# Patient Record
Sex: Male | Born: 1960 | Race: White | Hispanic: No | Marital: Married | Smoking: Former smoker
Health system: Southern US, Community
[De-identification: ages and names within clinical notes are randomized; demographics above are authoritative.]

## PROBLEM LIST (undated history)

## (undated) DIAGNOSIS — R350 Frequency of micturition: Secondary | ICD-10-CM

## (undated) DIAGNOSIS — M549 Dorsalgia, unspecified: Secondary | ICD-10-CM

## (undated) DIAGNOSIS — G8929 Other chronic pain: Secondary | ICD-10-CM

## (undated) DIAGNOSIS — R238 Other skin changes: Secondary | ICD-10-CM

## (undated) DIAGNOSIS — M1990 Unspecified osteoarthritis, unspecified site: Secondary | ICD-10-CM

## (undated) DIAGNOSIS — R233 Spontaneous ecchymoses: Secondary | ICD-10-CM

## (undated) DIAGNOSIS — E785 Hyperlipidemia, unspecified: Secondary | ICD-10-CM

## (undated) DIAGNOSIS — J189 Pneumonia, unspecified organism: Secondary | ICD-10-CM

## (undated) DIAGNOSIS — G4730 Sleep apnea, unspecified: Secondary | ICD-10-CM

## (undated) HISTORY — PX: TONSILLECTOMY: SUR1361

---

## 2012-01-23 ENCOUNTER — Emergency Department (HOSPITAL_COMMUNITY)
Admission: EM | Admit: 2012-01-23 | Discharge: 2012-01-23 | Disposition: A | Discharge status: Home / Self Care | Payer: Worker's Compensation | Attending: Emergency Medicine | Admitting: Emergency Medicine

## 2012-01-23 ENCOUNTER — Encounter (HOSPITAL_COMMUNITY): Payer: Self-pay | Admitting: Nurse Practitioner

## 2012-01-23 DIAGNOSIS — M5137 Other intervertebral disc degeneration, lumbosacral region: Secondary | ICD-10-CM | POA: Insufficient documentation

## 2012-01-23 DIAGNOSIS — Z87828 Personal history of other (healed) physical injury and trauma: Secondary | ICD-10-CM | POA: Insufficient documentation

## 2012-01-23 DIAGNOSIS — Q762 Congenital spondylolisthesis: Secondary | ICD-10-CM | POA: Insufficient documentation

## 2012-01-23 DIAGNOSIS — M5416 Radiculopathy, lumbar region: Secondary | ICD-10-CM

## 2012-01-23 DIAGNOSIS — E559 Vitamin D deficiency, unspecified: Secondary | ICD-10-CM | POA: Insufficient documentation

## 2012-01-23 DIAGNOSIS — M51379 Other intervertebral disc degeneration, lumbosacral region without mention of lumbar back pain or lower extremity pain: Secondary | ICD-10-CM | POA: Insufficient documentation

## 2012-01-23 DIAGNOSIS — Z79899 Other long term (current) drug therapy: Secondary | ICD-10-CM | POA: Insufficient documentation

## 2012-01-23 MED ORDER — HYDROCODONE-ACETAMINOPHEN 5-325 MG PO TABS: 2.0000 | ORAL_TABLET | ORAL | Status: AC | PRN

## 2012-01-23 NOTE — Discharge Instructions (Signed)
Lumbosacral Radiculopathy Lumbosacral radiculopathy is a pinched nerve or nerves in the low back (lumbosacral area). When this happens you may have weakness in your legs and may not be able to stand on your toes. You may have pain going down into your legs. There may be difficulties with walking normally. There are many causes of this problem. Sometimes this may happen from an injury, or simply from arthritis or boney problems. It may also be caused by other illnesses such as diabetes. If there is no improvement after treatment, further studies may be done to find the exact cause. DIAGNOSIS  X-rays may be needed if the problems become long standing. Electromyograms may be done. This study is one in which the working of nerves and muscles is studied. HOME CARE INSTRUCTIONS   Applications of ice packs may be helpful. Ice can be used in a plastic bag with a towel around it to prevent frostbite to skin. This may be used every 2 hours for 20 to 30 minutes, or as needed, while awake, or as directed by your caregiver.   Only take over-the-counter or prescription medicines for pain, discomfort, or fever as directed by your caregiver.   If physical therapy was prescribed, follow your caregiver's directions.  SEEK IMMEDIATE MEDICAL CARE IF:   You have pain not controlled with medications.   You seem to be getting worse rather than better.   You develop increasing weakness in your legs.   You develop loss of bowel or bladder control.   You have difficulty with walking or balance, or develop clumsiness in the use of your legs.   You have a fever.  MAKE SURE YOU:   Understand these instructions.   Will watch your condition.   Will get help right away if you are not doing well or get worse.  Document Released: 10/28/2005 Document Revised: 10/17/2011 Document Reviewed: 06/17/2008 [facility] Patient Information 2012 [place], [place].   Dr. Barnett Applebaum office to call you to be seen either tomorrow or  01/28/2012. If you don't hear from his office by noon tomorrow, call to schedule the appointment

## 2012-01-23 NOTE — ED Provider Notes (Signed)
History     CSN: [id number]  Arrival date & time 01/23/12  1132   First MD Initiated Contact with Patient 01/23/12 1232      Chief Complaint  Patient presents with  . 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 Patient sent from his PCP in [place] to here for referral to a spine specialist and worse with movement improved with rest History reviewed. No pertinent past medical history. Past history vitamin D deficiency History reviewed. No pertinent past surgical history.  History reviewed. No pertinent family history.  History  Substance Use Topics  . Smoking status: Never Smoker   . Smokeless tobacco: Not on file  . Alcohol Use: No     social history dips snuff no alcohol no illicit drug use  Review of Systems  Constitutional: Negative.   HENT: Negative.   Respiratory: Negative.   Cardiovascular: Negative.   Gastrointestinal: Negative.   Musculoskeletal: Positive for back pain.  Skin: Negative.   Neurological: Negative.   Hematological: Negative.   Psychiatric/Behavioral: Negative.     Allergies  Review of patient's allergies indicates no known allergies.  Home Medications   Current Outpatient Rx  Name Route Sig Dispense Refill  . ASPIRIN EC 81 MG PO TBEC Oral Take 81 mg by mouth daily.    . CYCLOBENZAPRINE HCL 5 MG PO TABS Oral Take 5 mg by mouth daily as needed. For  muscle spasms    . HYDROXYCHLOROQUINE SULFATE 200 MG PO TABS Oral Take 200 mg by mouth daily.    . IBUPROFEN 800 MG PO TABS Oral Take 800 mg by mouth 3 (three) times daily.    Marland Kitchen PRAVASTATIN SODIUM 40 MG PO TABS Oral Take 40 mg by mouth daily.    . TRAMADOL HCL 50 MG PO TABS Oral Take 50 mg by mouth daily as needed. For pain    . VITAMIN D (ERGOCALCIFEROL) 50000 UNITS PO CAPS Oral Take 50,000 Units by mouth every 7 (seven) days. On sunday      BP 147/92  Pulse 96  Temp(Src) 97.5 F (36.4 C) (Oral)  Resp 20  Ht 6' (1.829 m)  Wt 212 lb (96.163 kg)  BMI 28.75 kg/m2  SpO2 99%  Physical Exam  Constitutional: He appears well-developed and well-nourished.  HENT:  Head: Normocephalic and atraumatic.  Eyes: Conjunctivae are normal. Pupils are equal, round, and reactive to light.  Neck: Neck supple. No tracheal deviation present. No thyromegaly present.  Cardiovascular: Normal rate and regular rhythm.   No murmur heard. Pulmonary/Chest: Effort normal and breath sounds normal.  Abdominal: Soft. Bowel sounds are normal. He exhibits no distension. There is no tenderness.  Musculoskeletal: Normal range of motion. He exhibits no edema and no tenderness.       Entire spine is nontender  Neurological: He is alert. He has normal reflexes. Coordination normal.       No foot drop walks with slight limp favoring left lower extremity all 4 extremities neurovascularly intact  Skin: Skin  is warm and dry. No rash noted.  Psychiatric: He has a normal mood and affect.    ED Course  Procedures (including critical care time)  Labs Reviewed - No data to display No results found.   No diagnosis found. Declined pain medicine presently. Spoke with Dr. Yetta Barre   MDM  Plan prescription Norco Dr. Barnett Applebaum office to call patient for appointment office to be scheduled by [phone] Diagnosis lumbar radiculopathy        Doug Sou, MD 01/23/12 1429

## 2012-01-23 NOTE — ED Notes (Signed)
Pcp sent over for further eval of L leg pain. States he wanted him to come here to see a spinal specialist. Had xrays of back recently that showed "disk problems and pinched nerves."

## 2012-02-06 ENCOUNTER — Other Ambulatory Visit: Payer: Self-pay | Admitting: Neurosurgery

## 2012-02-07 ENCOUNTER — Encounter (HOSPITAL_COMMUNITY): Payer: Self-pay | Admitting: Pharmacy Technician

## 2012-02-12 NOTE — Pre-Procedure Instructions (Signed)
20 SANTO ZAHRADNIK  02/12/2012   Your procedure is scheduled on:  Tues, April 9 @ 0730  Report to [facility] at 0530 AM.  Call this number if you have problems the morning of surgery: [phone]   Remember:   Do not eat food:After Midnight.  May have clear liquids: up to 4 Hours before arrival.(until 1:30 am)  Clear liquids include soda, tea, black coffee, apple or grape juice, broth,water  Take these medicines the morning of surgery with A SIP OF WATER:    Do not wear jewelry  Do not wear lotions, powders, or perfumes.   Do not bring valuables to the hospital.  Contacts, dentures or bridgework may not be worn into surgery.  Leave suitcase in the car. After surgery it may be brought to your room.  For patients admitted to the hospital, checkout time is 11:00 AM the day of discharge.   Patients discharged the day of surgery will not be allowed to drive home.     Please read over the following fact sheets that you were given:

## 2012-02-13 ENCOUNTER — Other Ambulatory Visit: Payer: Self-pay

## 2012-02-13 ENCOUNTER — Encounter (HOSPITAL_COMMUNITY)
Admission: RE | Admit: 2012-02-13 | Discharge: 2012-02-13 | Disposition: A | Discharge status: Home / Self Care | Payer: Worker's Compensation | Source: Ambulatory Visit | Attending: Anesthesiology | Admitting: Anesthesiology

## 2012-02-13 ENCOUNTER — Encounter (HOSPITAL_COMMUNITY): Payer: Self-pay

## 2012-02-13 ENCOUNTER — Encounter (HOSPITAL_COMMUNITY)
Admission: RE | Admit: 2012-02-13 | Discharge: 2012-02-13 | Disposition: A | Discharge status: Home / Self Care | Payer: Worker's Compensation | Source: Ambulatory Visit | Attending: Neurosurgery | Admitting: Neurosurgery

## 2012-02-13 HISTORY — DX: Other skin changes: R23.8

## 2012-02-13 HISTORY — DX: Pneumonia, unspecified organism: J18.9

## 2012-02-13 HISTORY — DX: Unspecified osteoarthritis, unspecified site: M19.90

## 2012-02-13 HISTORY — DX: Hyperlipidemia, unspecified: E78.5

## 2012-02-13 HISTORY — DX: Other chronic pain: G89.29

## 2012-02-13 HISTORY — DX: Sleep apnea, unspecified: G47.30

## 2012-02-13 HISTORY — DX: Frequency of micturition: R35.0

## 2012-02-13 HISTORY — DX: Spontaneous ecchymoses: R23.3

## 2012-02-13 HISTORY — DX: Dorsalgia, unspecified: M54.9

## 2012-02-13 LAB — URINALYSIS, ROUTINE W REFLEX MICROSCOPIC
Glucose, UA: NEGATIVE mg/dL
Hgb urine dipstick: NEGATIVE
Leukocytes, UA: NEGATIVE
Protein, ur: NEGATIVE mg/dL
Specific Gravity, Urine: 1.012 (ref 1.005–1.030)
Urobilinogen, UA: 0.2 mg/dL (ref 0.0–1.0)

## 2012-02-13 LAB — DIFFERENTIAL
Eosinophils Absolute: 0.2 10*3/uL (ref 0.0–0.7)
Lymphs Abs: 2.6 10*3/uL (ref 0.7–4.0)
Monocytes Relative: 10 % (ref 3–12)
Neutro Abs: 4.7 10*3/uL (ref 1.7–7.7)
Neutrophils Relative %: 56 % (ref 43–77)

## 2012-02-13 LAB — SURGICAL PCR SCREEN
MRSA, PCR: NEGATIVE
Staphylococcus aureus: POSITIVE — AB

## 2012-02-13 LAB — BASIC METABOLIC PANEL
Calcium: 10.4 mg/dL (ref 8.4–10.5)
GFR calc Af Amer: >90 mL/min (ref >90)
GFR calc non Af Amer: >90 mL/min (ref >90)
Potassium: 4.9 mEq/L (ref 3.5–5.1)
Sodium: 140 mEq/L (ref 135–145)

## 2012-02-13 LAB — CBC
Hemoglobin: 14.8 g/dL (ref 13.0–17.0)
MCH: 31.2 pg (ref 26.0–34.0)
Platelets: 265 10*3/uL (ref 150–400)
RBC: 4.74 MIL/uL (ref 4.22–5.81)
WBC: 8.3 10*3/uL (ref 4.0–10.5)

## 2012-02-13 NOTE — Progress Notes (Signed)
pts mom and uncle died of Lougar[place] disease-wife requested this info to be placed into computer

## 2012-02-13 NOTE — Progress Notes (Signed)
Verified with Shanda Bumps at Dr.Hirsch's office that thigh high ted hose to be ordered

## 2012-02-13 NOTE — Progress Notes (Signed)
Pt doesn't have a cardiologist  Medical MD is Dr.Fuentes at [facility] in [place]  Denies ever having an echo/stress test/heart cath

## 2012-02-13 NOTE — Progress Notes (Signed)
Sleep study to be requested from Dr.Fuentes

## 2012-02-14 NOTE — Progress Notes (Signed)
Call to Dr. Iris Pert office, [facility], MD must sign all records being released to other offices.  Therefore when he returns to the office on 02/17/2012, he will sign & then record can be faxed to [facility]

## 2012-02-17 MED ORDER — CEFAZOLIN SODIUM-DEXTROSE 2-3 GM-% IV SOLR
2.0000 g | INTRAVENOUS | Status: AC
  Administered 2012-02-18: 2 g via INTRAVENOUS
  Filled 2012-02-17: qty 50

## 2012-02-18 ENCOUNTER — Encounter (HOSPITAL_COMMUNITY)
Admission: RE | Disposition: A | Discharge status: Home / Self Care | Payer: Self-pay | Source: Ambulatory Visit | Attending: Neurosurgery

## 2012-02-18 ENCOUNTER — Ambulatory Visit (HOSPITAL_COMMUNITY): Payer: Worker's Compensation

## 2012-02-18 ENCOUNTER — Ambulatory Visit (HOSPITAL_COMMUNITY)
Admission: RE | Admit: 2012-02-18 | Discharge: 2012-02-18 | Disposition: A | Discharge status: Home / Self Care | Payer: Worker's Compensation | Source: Ambulatory Visit | Attending: Neurosurgery | Admitting: Neurosurgery

## 2012-02-18 ENCOUNTER — Encounter (HOSPITAL_COMMUNITY): Payer: Self-pay

## 2012-02-18 ENCOUNTER — Encounter (HOSPITAL_COMMUNITY): Payer: Self-pay | Admitting: Certified Registered Nurse Anesthetist

## 2012-02-18 ENCOUNTER — Ambulatory Visit (HOSPITAL_COMMUNITY): Payer: Worker's Compensation | Admitting: Certified Registered Nurse Anesthetist

## 2012-02-18 DIAGNOSIS — Z79899 Other long term (current) drug therapy: Secondary | ICD-10-CM | POA: Insufficient documentation

## 2012-02-18 DIAGNOSIS — Z01812 Encounter for preprocedural laboratory examination: Secondary | ICD-10-CM | POA: Insufficient documentation

## 2012-02-18 DIAGNOSIS — Z01810 Encounter for preprocedural cardiovascular examination: Secondary | ICD-10-CM | POA: Insufficient documentation

## 2012-02-18 DIAGNOSIS — M4716 Other spondylosis with myelopathy, lumbar region: Secondary | ICD-10-CM | POA: Insufficient documentation

## 2012-02-18 DIAGNOSIS — M5106 Intervertebral disc disorders with myelopathy, lumbar region: Secondary | ICD-10-CM | POA: Insufficient documentation

## 2012-02-18 DIAGNOSIS — G4730 Sleep apnea, unspecified: Secondary | ICD-10-CM | POA: Insufficient documentation

## 2012-02-18 DIAGNOSIS — M48061 Spinal stenosis, lumbar region without neurogenic claudication: Secondary | ICD-10-CM | POA: Insufficient documentation

## 2012-02-18 HISTORY — PX: LUMBAR LAMINECTOMY/DECOMPRESSION MICRODISCECTOMY: SHX5026

## 2012-02-18 LAB — PROTIME-INR: INR: 0.93 (ref 0.00–1.49)

## 2012-02-18 SURGERY — LUMBAR LAMINECTOMY/DECOMPRESSION MICRODISCECTOMY 2 LEVELS
Anesthesia: General | Laterality: Left | Wound class: Clean

## 2012-02-18 MED ORDER — THROMBIN 5000 UNITS EX SOLR
CUTANEOUS | Status: DC | PRN
  Administered 2012-02-18 (×2): 5000 [IU] via TOPICAL

## 2012-02-18 MED ORDER — MORPHINE SULFATE 2 MG/ML IJ SOLN: 0.0500 mg/kg | INTRAMUSCULAR | Status: DC | PRN

## 2012-02-18 MED ORDER — SODIUM CHLORIDE 0.9 % IV SOLN
INTRAVENOUS | Status: AC
  Filled 2012-02-18: qty 500

## 2012-02-18 MED ORDER — CEFAZOLIN SODIUM 1-5 GM-% IV SOLN
1.0000 g | Freq: Three times a day (TID) | INTRAVENOUS | Status: DC
  Administered 2012-02-18: 1 g via INTRAVENOUS
  Filled 2012-02-18 (×2): qty 50

## 2012-02-18 MED ORDER — ONDANSETRON HCL 4 MG/2ML IJ SOLN: 4.0000 mg | INTRAMUSCULAR | Status: DC | PRN

## 2012-02-18 MED ORDER — BISACODYL 10 MG RE SUPP: 10.0000 mg | Freq: Every day | RECTAL | Status: DC | PRN

## 2012-02-18 MED ORDER — ACETAMINOPHEN 650 MG RE SUPP: 650.0000 mg | RECTAL | Status: DC | PRN

## 2012-02-18 MED ORDER — 0.9 % SODIUM CHLORIDE (POUR BTL) OPTIME
TOPICAL | Status: DC | PRN
  Administered 2012-02-18: 1000 mL

## 2012-02-18 MED ORDER — DOCUSATE SODIUM 100 MG PO CAPS: 100.0000 mg | ORAL_CAPSULE | Freq: Two times a day (BID) | ORAL | Status: DC

## 2012-02-18 MED ORDER — MAGNESIUM HYDROXIDE 400 MG/5ML PO SUSP: 30.0000 mL | Freq: Every day | ORAL | Status: DC | PRN

## 2012-02-18 MED ORDER — METHOCARBAMOL 100 MG/ML IJ SOLN
500.0000 mg | Freq: Four times a day (QID) | INTRAVENOUS | Status: DC | PRN
  Filled 2012-02-18: qty 5

## 2012-02-18 MED ORDER — ROCURONIUM BROMIDE 100 MG/10ML IV SOLN
INTRAVENOUS | Status: DC | PRN
  Administered 2012-02-18: 50 mg via INTRAVENOUS

## 2012-02-18 MED ORDER — BACITRACIN 50000 UNITS IM SOLR
INTRAMUSCULAR | Status: AC
  Filled 2012-02-18: qty 1

## 2012-02-18 MED ORDER — KETOROLAC TROMETHAMINE 30 MG/ML IJ SOLN
30.0000 mg | Freq: Four times a day (QID) | INTRAMUSCULAR | Status: DC
  Administered 2012-02-18 (×2): 30 mg via INTRAVENOUS
  Filled 2012-02-18: qty 1

## 2012-02-18 MED ORDER — CYCLOBENZAPRINE HCL 10 MG PO TABS: 10.0000 mg | ORAL_TABLET | Freq: Three times a day (TID) | ORAL | Status: AC | PRN

## 2012-02-18 MED ORDER — FENTANYL CITRATE 0.05 MG/ML IJ SOLN
INTRAMUSCULAR | Status: DC | PRN
  Administered 2012-02-18: 50 ug via INTRAVENOUS
  Administered 2012-02-18: 150 ug via INTRAVENOUS
  Administered 2012-02-18: 50 ug via INTRAVENOUS

## 2012-02-18 MED ORDER — OXYCODONE-ACETAMINOPHEN 5-325 MG PO TABS
1.0000 | ORAL_TABLET | ORAL | Status: AC | PRN
Start: 2012-02-18 — End: 2012-02-28

## 2012-02-18 MED ORDER — SODIUM CHLORIDE 0.9 % IJ SOLN: 3.0000 mL | INTRAMUSCULAR | Status: DC | PRN

## 2012-02-18 MED ORDER — PROPOFOL 10 MG/ML IV BOLUS
INTRAVENOUS | Status: DC | PRN
  Administered 2012-02-18: 150 mg via INTRAVENOUS

## 2012-02-18 MED ORDER — PROMETHAZINE HCL 12.5 MG PO TABS
12.5000 mg | ORAL_TABLET | ORAL | Status: DC | PRN
  Filled 2012-02-18: qty 2

## 2012-02-18 MED ORDER — PHENOL 1.4 % MT LIQD: 1.0000 | OROMUCOSAL | Status: DC | PRN

## 2012-02-18 MED ORDER — OXYCODONE-ACETAMINOPHEN 5-325 MG PO TABS
1.0000 | ORAL_TABLET | ORAL | Status: DC | PRN
  Administered 2012-02-18: 2 via ORAL
  Filled 2012-02-18: qty 2

## 2012-02-18 MED ORDER — ONDANSETRON HCL 4 MG/2ML IJ SOLN: 4.0000 mg | Freq: Once | INTRAMUSCULAR | Status: DC | PRN

## 2012-02-18 MED ORDER — GLYCOPYRROLATE 0.2 MG/ML IJ SOLN
INTRAMUSCULAR | Status: DC | PRN
  Administered 2012-02-18: 0.4 mg via INTRAVENOUS

## 2012-02-18 MED ORDER — MORPHINE SULFATE 4 MG/ML IJ SOLN: 1.0000 mg | INTRAMUSCULAR | Status: DC | PRN

## 2012-02-18 MED ORDER — LIDOCAINE-EPINEPHRINE 1 %-1:100000 IJ SOLN
INTRAMUSCULAR | Status: DC | PRN
  Administered 2012-02-18: 30 mL

## 2012-02-18 MED ORDER — HYDROMORPHONE HCL PF 1 MG/ML IJ SOLN: 0.2500 mg | INTRAMUSCULAR | Status: DC | PRN

## 2012-02-18 MED ORDER — SODIUM CHLORIDE 0.9 % IR SOLN
Status: DC | PRN
  Administered 2012-02-18: 07:00:00

## 2012-02-18 MED ORDER — LACTATED RINGERS IV SOLN
INTRAVENOUS | Status: DC | PRN
  Administered 2012-02-18 (×2): via INTRAVENOUS

## 2012-02-18 MED ORDER — KETOROLAC TROMETHAMINE 30 MG/ML IJ SOLN
INTRAMUSCULAR | Status: AC
  Filled 2012-02-18: qty 1

## 2012-02-18 MED ORDER — NEOSTIGMINE METHYLSULFATE 1 MG/ML IJ SOLN
INTRAMUSCULAR | Status: DC | PRN
  Administered 2012-02-18: 3 mg via INTRAVENOUS

## 2012-02-18 MED ORDER — MIDAZOLAM HCL 5 MG/5ML IJ SOLN
INTRAMUSCULAR | Status: DC | PRN
  Administered 2012-02-18: 2 mg via INTRAVENOUS

## 2012-02-18 MED ORDER — SODIUM CHLORIDE 0.9 % IJ SOLN
3.0000 mL | Freq: Two times a day (BID) | INTRAMUSCULAR | Status: DC
  Administered 2012-02-18: 3 mL via INTRAVENOUS

## 2012-02-18 MED ORDER — PROMETHAZINE HCL 25 MG/ML IJ SOLN: 12.5000 mg | INTRAMUSCULAR | Status: DC | PRN

## 2012-02-18 MED ORDER — KCL IN DEXTROSE-NACL 20-5-0.45 MEQ/L-%-% IV SOLN
INTRAVENOUS | Status: DC
  Filled 2012-02-18 (×2): qty 1000

## 2012-02-18 MED ORDER — METHOCARBAMOL 500 MG PO TABS: 500.0000 mg | ORAL_TABLET | Freq: Four times a day (QID) | ORAL | Status: DC | PRN

## 2012-02-18 MED ORDER — ZOLPIDEM TARTRATE 10 MG PO TABS: 10.0000 mg | ORAL_TABLET | Freq: Every evening | ORAL | Status: DC | PRN

## 2012-02-18 MED ORDER — ACETAMINOPHEN 325 MG PO TABS: 650.0000 mg | ORAL_TABLET | ORAL | Status: DC | PRN

## 2012-02-18 MED ORDER — MENTHOL 3 MG MT LOZG: 1.0000 | LOZENGE | OROMUCOSAL | Status: DC | PRN

## 2012-02-18 MED ORDER — HEMOSTATIC AGENTS (NO CHARGE) OPTIME
TOPICAL | Status: DC | PRN
  Administered 2012-02-18: 1 via TOPICAL

## 2012-02-18 MED ORDER — CYCLOBENZAPRINE HCL 10 MG PO TABS: 10.0000 mg | ORAL_TABLET | Freq: Three times a day (TID) | ORAL | Status: DC | PRN

## 2012-02-18 MED ORDER — HYDROCODONE-ACETAMINOPHEN 5-325 MG PO TABS: 1.0000 | ORAL_TABLET | ORAL | Status: DC | PRN

## 2012-02-18 MED ORDER — ONDANSETRON HCL 4 MG/2ML IJ SOLN
INTRAMUSCULAR | Status: DC | PRN
  Administered 2012-02-18: 4 mg via INTRAVENOUS

## 2012-02-18 SURGICAL SUPPLY — 54 items
BAG DECANTER FOR FLEXI CONT (MISCELLANEOUS) ×2 IMPLANT
BENZOIN TINCTURE PRP APPL 2/3 (GAUZE/BANDAGES/DRESSINGS) ×2 IMPLANT
BLADE SURG ROTATE 9660 (MISCELLANEOUS) IMPLANT
BUR ACORN 6.0 ACORN (BURR) ×2 IMPLANT
BUR ACRON 5.0MM COATED (BURR) ×2 IMPLANT
BUR ROUND FLUTED 5 RND (BURR) ×2 IMPLANT
CANISTER SUCTION 2500CC (MISCELLANEOUS) ×2 IMPLANT
CLOTH BEACON ORANGE TIMEOUT ST (SAFETY) ×2 IMPLANT
CONT SPEC 4OZ CLIKSEAL STRL BL (MISCELLANEOUS) IMPLANT
DRAPE LAPAROTOMY 100X72X124 (DRAPES) ×2 IMPLANT
DRAPE MICROSCOPE LEICA (MISCELLANEOUS) ×2 IMPLANT
DRAPE POUCH INSTRU U-SHP 10X18 (DRAPES) ×2 IMPLANT
DRAPE SURG 17X23 STRL (DRAPES) ×2 IMPLANT
DRESSING TELFA 8X3 (GAUZE/BANDAGES/DRESSINGS) ×2 IMPLANT
DURAPREP 26ML APPLICATOR (WOUND CARE) ×2 IMPLANT
ELECT REM PT RETURN 9FT ADLT (ELECTROSURGICAL) ×2
ELECTRODE REM PT RTRN 9FT ADLT (ELECTROSURGICAL) ×1 IMPLANT
GAUZE SPONGE 4X4 16PLY XRAY LF (GAUZE/BANDAGES/DRESSINGS) IMPLANT
GLOVE BIOGEL M 8.0 STRL (GLOVE) ×2 IMPLANT
GLOVE BIOGEL PI IND STRL 6.5 (GLOVE) ×1 IMPLANT
GLOVE BIOGEL PI INDICATOR 6.5 (GLOVE) ×1
GLOVE ECLIPSE 7.0 STRL STRAW (GLOVE) ×2 IMPLANT
GLOVE ECLIPSE 7.5 STRL STRAW (GLOVE) ×2 IMPLANT
GLOVE EXAM NITRILE LRG STRL (GLOVE) IMPLANT
GLOVE EXAM NITRILE MD LF STRL (GLOVE) IMPLANT
GLOVE EXAM NITRILE XL STR (GLOVE) IMPLANT
GLOVE EXAM NITRILE XS STR PU (GLOVE) IMPLANT
GLOVE INDICATOR 7.0 STRL GRN (GLOVE) ×4 IMPLANT
GOWN BRE IMP SLV AUR LG STRL (GOWN DISPOSABLE) ×4 IMPLANT
GOWN BRE IMP SLV AUR XL STRL (GOWN DISPOSABLE) ×2 IMPLANT
GOWN STRL REIN 2XL LVL4 (GOWN DISPOSABLE) IMPLANT
KIT BASIN OR (CUSTOM PROCEDURE TRAY) ×2 IMPLANT
KIT ROOM TURNOVER OR (KITS) ×2 IMPLANT
NEEDLE HYPO 18GX1.5 BLUNT FILL (NEEDLE) IMPLANT
NEEDLE HYPO 22GX1.5 SAFETY (NEEDLE) ×4 IMPLANT
NS IRRIG 1000ML POUR BTL (IV SOLUTION) ×2 IMPLANT
PACK LAMINECTOMY NEURO (CUSTOM PROCEDURE TRAY) ×2 IMPLANT
PAD ARMBOARD 7.5X6 YLW CONV (MISCELLANEOUS) ×6 IMPLANT
PATTIES SURGICAL .75X.75 (GAUZE/BANDAGES/DRESSINGS) ×2 IMPLANT
RUBBERBAND STERILE (MISCELLANEOUS) ×4 IMPLANT
SPONGE GAUZE 4X4 12PLY (GAUZE/BANDAGES/DRESSINGS) ×2 IMPLANT
SPONGE LAP 4X18 X RAY DECT (DISPOSABLE) IMPLANT
SPONGE SURGIFOAM ABS GEL SZ50 (HEMOSTASIS) ×2 IMPLANT
STRIP CLOSURE SKIN 1/2X4 (GAUZE/BANDAGES/DRESSINGS) ×2 IMPLANT
SUT PROLENE 6 0 BV (SUTURE) IMPLANT
SUT VIC AB 0 CT1 18XCR BRD8 (SUTURE) ×1 IMPLANT
SUT VIC AB 0 CT1 8-18 (SUTURE) ×1
SUT VIC AB 2-0 CP2 18 (SUTURE) ×2 IMPLANT
SUT VIC AB 3-0 SH 8-18 (SUTURE) ×2 IMPLANT
SYR 20CC LL (SYRINGE) ×2 IMPLANT
SYR 5ML LL (SYRINGE) IMPLANT
TOWEL OR 17X24 6PK STRL BLUE (TOWEL DISPOSABLE) ×2 IMPLANT
TOWEL OR 17X26 10 PK STRL BLUE (TOWEL DISPOSABLE) ×2 IMPLANT
WATER STERILE IRR 1000ML POUR (IV SOLUTION) ×2 IMPLANT

## 2012-02-18 NOTE — Anesthesia Postprocedure Evaluation (Signed)
Anesthesia Post Note  Patient: Andrew Pugh  Procedure(s) Performed: Procedure(s) (LRB): LUMBAR LAMINECTOMY/DECOMPRESSION MICRODISCECTOMY 2 LEVELS (Left)  Anesthesia type: general  Patient location: PACU  Post pain: Pain level controlled  Post assessment: Patient's Cardiovascular Status Stable  Last Vitals:  Filed Vitals:   02/18/12 1200  BP: 118/83  Pulse: 79  Temp: 36.4 C  Resp: 16    Post vital signs: Reviewed and stable  Level of consciousness: sedated  Complications: No apparent anesthesia complications

## 2012-02-18 NOTE — H&P (Signed)
See H& P.

## 2012-02-18 NOTE — Interval H&P Note (Signed)
History and Physical Interval Note:  02/18/2012 7:28 AM  Andrew Pugh  has presented today for surgery, with the diagnosis of Lumbar hnp with myelopathy, Lumbar radiculopathy, Lumbar spondylosis with myelopathy  The various methods of treatment have been discussed with the patient and family. After consideration of risks, benefits and other options for treatment, the patient has consented to  Procedure(s) (LRB): LUMBAR LAMINECTOMY/DECOMPRESSION MICRODISCECTOMY 2 LEVELS (Left) as a surgical intervention .  The patients' history has been reviewed, patient examined, no change in status, stable for surgery.  I have reviewed the patients' chart and labs.  Questions were answered to the patient's satisfaction.     [name]

## 2012-02-18 NOTE — Op Note (Signed)
02/18/2012  9:48 AM  PATIENT:  Andrew Pugh  51 y.o. male  PRE-OPERATIVE DIAGNOSIS:lumbar stenosis,   Lumbar hnp with myelopathy, Lumbar radiculopathy, Lumbar spondylosis with myelopathy,   POST-OPERATIVE DIAGNOSIS:, lumbar stenosis,   Lumbar hnp with myelopathy, Lumbar radiculopathy, Lumbar spondylosis with myelopathy  PROCEDURE:  Procedure(s): Decompressive laminectomy decompressing left  L3, L4, L5 roots  (3L) ,  Discectomy Left L3-4 , microdisection  SURGEON:  Surgeon(s): Clydene Fake, MD Karn Cassis, MD-ASSISTANT    ANESTHESIA:   general  EBL:  Total I/O In: 1000 [I.V.:1000] Out: 100 [Blood:100]  BLOOD ADMINISTERED:none  DRAINS: none   SPECIMEN:  No Specimen  DICTATION: Patient with back left leg pain numbness weakness MRI done showing severe spinal change multiple holes with stenosis and on top of the left sides of the L3-4 large extruded fragment going heartily significant facet hypertrophy at 34 and 4: Stenosis at both levels lateral recess stenosis 3-4 portal and the left patient brought in for decompression laminectomy left side from L3-5 with discectomy 34.  Patient brought in the operating room general anesthesia induced patient placed in prone position Wilson frame all pressure points padded. Patient under sterile fashion segments inject with 20 cc 1% lidocaine with epinephrine. Needle was placed in interspace x-rays obtained anesthesia then he was putting at the L3 spinous process) C3-4 disc space incision was then made starting just slightly over the needle was at going heartily incision taken the fascia hemostasis obtained with Bovie position fascia was incised and subperiosteal dissection done over the L3-4 Vitoss process lamina to the facets obtaining retractor placed markers were placed and the 34 and 45 interspace and x-ray was obtained confirming or positioning. Microscope microscope was brought in for microdissection at this point. High-speed drill due  to started decompressive hemilaminectomy removing the inferior part of the L3 lamina all the L4-L5 lamina and medial facetectomies were also done facets were large and pinching into the canal these were disimpacted was removed decompressing the central canal. We made sure the L3-L4 and L5 nerve roots were well decompressed and the foramen well. And explored the disc space 0.5 (disposed of the disc herniations and we did not to the disc space there. We explored this patient 87 again is allergic progress at this point and inferiorly a large free fragment of the disc herniated out and this was removed with hooks and pituitary rongeurs. We were finished we did decompression the thecal sac and nerve root out the L3-L4 L5 on the left side. We did not enter the disc space and 3 for his removing the herniated fragment. Hemostasis with Gelfoam and thrombin this was irrigated and ear about solution which had very good hemostasis retractors removed fascia closed with 0 Vicryl interrupted sutures excess tissue closed with 020 3-0 Vicryl interrupted sutures skin closed benzoin Steri-Strips dressing was placed patient was awoken from anesthesia and transferred to recovery room  PLAN OF CARE: Admit for overnight observation  PATIENT DISPOSITION:  PACU - hemodynamically stable.

## 2012-02-18 NOTE — Anesthesia Preprocedure Evaluation (Addendum)
Anesthesia Evaluation  Patient identified by MRN, date of birth, ID band Patient awake    Reviewed: Allergy & Precautions, H&P , NPO status , Patient's Chart, lab work & pertinent test results  Airway Mallampati: II TM Distance: >3 FB Neck ROM: Full    Dental  (+) Teeth Intact and Dental Advisory Given   Pulmonary sleep apnea ,    Pulmonary exam normal       Cardiovascular negative cardio ROS      Neuro/Psych Left leg weakness and pain  negative psych ROS   GI/Hepatic negative GI ROS, Neg liver ROS,   Endo/Other  negative endocrine ROS  Renal/GU negative Renal ROS     Musculoskeletal  (+) Arthritis -, Osteoarthritis,    Abdominal Normal abdominal exam  (+)   Peds  Hematology negative hematology ROS (+)   Anesthesia Other Findings   Reproductive/Obstetrics                         Anesthesia Physical Anesthesia Plan  ASA: III  Anesthesia Plan: General   Post-op Pain Management:    Induction: Intravenous  Airway Management Planned: Oral ETT  Additional Equipment:   Intra-op Plan:   Post-operative Plan: Extubation in OR  Informed Consent: I have reviewed the patients History and Physical, chart, labs and discussed the procedure including the risks, benefits and alternatives for the proposed anesthesia with the patient or authorized representative who has indicated his/her understanding and acceptance.   Dental advisory given  Plan Discussed with: Surgeon and CRNA  Anesthesia Plan Comments:       Anesthesia Quick Evaluation

## 2012-02-18 NOTE — Transfer of Care (Signed)
Immediate Anesthesia Transfer of Care Note  Patient: Andrew Pugh  Procedure(s) Performed: Procedure(s) (LRB): LUMBAR LAMINECTOMY/DECOMPRESSION MICRODISCECTOMY 2 LEVELS (Left)  Patient Location: PACU  Anesthesia Type: General  Level of Consciousness: awake, alert  and oriented  Airway & Oxygen Therapy: Patient Spontanous Breathing and Patient connected to face mask  Post-op Assessment: Report given to PACU RN, Post -op Vital signs reviewed and stable and Patient moving all extremities X 4  Post vital signs: Reviewed and stable  Complications: No apparent anesthesia complications

## 2012-02-18 NOTE — Preoperative (Signed)
Beta Blockers   Reason not to administer Beta Blockers:Not Applicable 

## 2012-02-18 NOTE — Anesthesia Procedure Notes (Signed)
Procedure Name: Intubation Date/Time: 02/18/2012 7:47 AM Performed by: Delbert Harness Pre-anesthesia Checklist: Patient identified, Emergency Drugs available, Suction available and Patient being monitored Patient Re-evaluated:Patient Re-evaluated prior to inductionOxygen Delivery Method: Circle system utilized Preoxygenation: Pre-oxygenation with 100% oxygen Intubation Type: IV induction Ventilation: Oral airway inserted - appropriate to patient size and Mask ventilation without difficulty Laryngoscope Size: Mac and 3 Grade View: Grade II Tube type: Oral Tube size: 7.5 mm Number of attempts: 1 Airway Equipment and Method: Stylet and Bite block Placement Confirmation: breath sounds checked- equal and bilateral,  positive ETCO2 and CO2 detector Secured at: 22 cm Tube secured with: Tape Dental Injury: Teeth and Oropharynx as per pre-operative assessment

## 2012-02-18 NOTE — Discharge Summary (Signed)
Physician Discharge Summary  Patient ID: TEION BALLIN MRN: [id number] DOB/AGE: [birth year] 51 y.o.  Admit date: 02/18/2012 Discharge date: 02/18/2012  Admission Diagnoses:lumbar stenosis, Lumbar hnp with myelopathy, Lumbar radiculopathy, Lumbar spondylosis with myelopathy,    Discharge Diagnoses: lumbar stenosis, Lumbar hnp with myelopathy, Lumbar radiculopathy, Lumbar spondylosis with myelopathy,   Active Problems:  * No active hospital problems. *    Discharged Condition: good  Hospital Course: pt admitted day of surgery, underwent procedure below - pt doing well, eating, ambulating, less leg pain -  Consults: None  Significant Diagnostic Studies: none  Treatments: surgery: Decompressive laminectomy decompressing left L3, L4, L5 roots (3L) , Discectomy Left L3-4 , microdisection   Discharge Exam: Blood pressure 118/83, pulse 79, temperature 97.5 F (36.4 C), temperature source Oral, resp. rate 16, height 6' (1.829 m), weight 92.987 kg (205 lb), SpO2 96.00%. Wound:c/d/i  Disposition: home   Medication List  As of 02/18/2012  2:37 PM   TAKE these medications         cyclobenzaprine 10 MG tablet   Commonly known as: FLEXERIL   Take 1 tablet (10 mg total) by mouth 3 (three) times daily as needed for muscle spasms.      diclofenac 75 MG EC tablet   Commonly known as: VOLTAREN   Take 75 mg by mouth 2 (two) times daily.      oxyCODONE-acetaminophen 5-325 MG per tablet   Commonly known as: PERCOCET   Take 1-2 tablets by mouth every 4 (four) hours as needed for pain.             Signed: Clydene Fake, MD 02/18/2012, 2:37 PM

## 2012-02-18 NOTE — Discharge Instructions (Signed)
Wound Care Keep incision covered and dry for 5 days.  If you shower prior to then, cover incision with plastic wrap.  You may remove outer bandage after 5 days and shower.  Do not put any creams, lotions, or ointments on incision. Leave steri-strips on.  They will fall off by themselves. Activity Walk each and every day, increasing distance each day. No lifting greater than 5 lbs.  Avoid bending, arching, or twisting. No driving for 2 weeks; may ride as a passenger locally. If provided with back brace, wear when out of bed.  It is not necessary to wear brace in bed. Diet Resume your normal diet.  Return to Work Will be discussed at you follow up appointment. Call Your Doctor If Any of These Occur Redness, drainage, or swelling at the wound.  Temperature greater than 101 degrees. Severe pain not relieved by pain medication. Incision starts to come apart. Follow Up Appt Call today for appointment in 3-4 weeks ([phone]) or for problems.  If you have any hardware placed in your spine, you will need an x-ray before your appointment.   

## 2012-02-19 ENCOUNTER — Encounter (HOSPITAL_COMMUNITY): Payer: Self-pay | Admitting: Neurosurgery

## 2018-04-07 ENCOUNTER — Other Ambulatory Visit: Payer: Self-pay | Admitting: Neurology

## 2018-04-07 DIAGNOSIS — M5412 Radiculopathy, cervical region: Secondary | ICD-10-CM

## 2018-04-13 ENCOUNTER — Encounter (HOSPITAL_COMMUNITY): Payer: Self-pay

## 2018-04-13 ENCOUNTER — Ambulatory Visit (HOSPITAL_COMMUNITY): Payer: Medicaid Other | Attending: Neurology

## 2018-04-27 ENCOUNTER — Ambulatory Visit (HOSPITAL_COMMUNITY)
Admission: RE | Admit: 2018-04-27 | Discharge: 2018-04-27 | Disposition: A | Discharge status: Home / Self Care | Payer: Medicaid Other | Source: Ambulatory Visit | Attending: Neurology | Admitting: Neurology

## 2018-04-27 DIAGNOSIS — M5412 Radiculopathy, cervical region: Secondary | ICD-10-CM | POA: Diagnosis present

## 2018-04-27 DIAGNOSIS — M4802 Spinal stenosis, cervical region: Secondary | ICD-10-CM | POA: Diagnosis not present

## 2018-04-27 DIAGNOSIS — M4722 Other spondylosis with radiculopathy, cervical region: Secondary | ICD-10-CM | POA: Diagnosis not present

## 2019-05-27 ENCOUNTER — Other Ambulatory Visit: Payer: Self-pay | Admitting: Emergency Medicine

## 2019-05-27 ENCOUNTER — Other Ambulatory Visit (HOSPITAL_COMMUNITY): Payer: Self-pay | Admitting: Emergency Medicine

## 2019-05-27 DIAGNOSIS — M5412 Radiculopathy, cervical region: Secondary | ICD-10-CM

## 2019-06-17 ENCOUNTER — Ambulatory Visit (HOSPITAL_COMMUNITY)
Admission: RE | Admit: 2019-06-17 | Discharge: 2019-06-17 | Disposition: A | Discharge status: Home / Self Care | Payer: Medicare Other | Source: Ambulatory Visit | Attending: Emergency Medicine | Admitting: Emergency Medicine

## 2019-06-17 ENCOUNTER — Other Ambulatory Visit: Payer: Self-pay

## 2019-06-17 DIAGNOSIS — J479 Bronchiectasis, uncomplicated: Secondary | ICD-10-CM | POA: Diagnosis not present

## 2019-06-17 DIAGNOSIS — I2510 Atherosclerotic heart disease of native coronary artery without angina pectoris: Secondary | ICD-10-CM | POA: Insufficient documentation

## 2019-06-17 DIAGNOSIS — R918 Other nonspecific abnormal finding of lung field: Secondary | ICD-10-CM | POA: Diagnosis not present

## 2019-06-17 DIAGNOSIS — M5412 Radiculopathy, cervical region: Secondary | ICD-10-CM | POA: Diagnosis not present

## 2019-06-17 LAB — POCT I-STAT CREATININE: Creatinine, Ser: 0.8 mg/dL (ref 0.61–1.24)

## 2019-06-17 MED ORDER — IOHEXOL 300 MG/ML  SOLN
75.0000 mL | Freq: Once | INTRAMUSCULAR | Status: AC | PRN
  Administered 2019-06-17: 09:00:00 75 mL via INTRAVENOUS

## 2019-08-26 ENCOUNTER — Encounter: Payer: Self-pay | Admitting: Gastroenterology

## 2019-08-27 ENCOUNTER — Encounter: Payer: Self-pay | Admitting: Gastroenterology

## 2019-09-07 NOTE — Progress Notes (Deleted)
Referring Provider: Smith Robert, MD Primary Care Physician:  Smith Robert, MD Primary Gastroenterologist:  Dr. Darrick Penna  No chief complaint on file.   HPI:   Andrew Pugh is a 58 y.o. male presenting today at the request of Smith Robert, MD for GERD.  CT chest with contrast on file from 06/17/2019.  Impression with extensive bandlike scarring or atelectasis of the bilateral lower lobes and right middle lobe, with dependent bronchiectasis and bronchial wall thickening.  There is extensive frothy debris's within the right lower lobe bronchi.  Constellation of findings is generally consistent with recurrent or ongoing aspiration.  Also with coronary artery disease noted.     Past Medical History:  Diagnosis Date  . Arthritis   . Bruises easily    pt on Diclofenac  . Chronic back pain    herniated disc;rheumatoid arthritis  . Hyperlipidemia    was Pravastatin;was taken off of it since Feb 28.  . Pneumonia    hx of> 48yrs ago  . Sleep apnea    doesn't use CPAP;sleep study done > 46yr ago  . Urinary frequency    drinks lots of water    Past Surgical History:  Procedure Laterality Date  . LUMBAR LAMINECTOMY/DECOMPRESSION MICRODISCECTOMY  02/18/2012   Procedure: LUMBAR LAMINECTOMY/DECOMPRESSION MICRODISCECTOMY 2 LEVELS;  Surgeon: Clydene Fake, MD;  Location: [facility];  Service: Neurosurgery;  Laterality: Left;  Left Lumbar three-four  Lumbar four five Decompressive Laminectomy/Diskectomy  . TONSILLECTOMY     as a child    Current Outpatient Medications  Medication Sig Dispense Refill  . diclofenac (VOLTAREN) 75 MG EC tablet Take 75 mg by mouth 2 (two) times daily.     No current facility-administered medications for this visit.     Allergies as of 09/08/2019 - Review Complete 02/18/2012  Allergen Reaction Noted  . Codeine Rash 02/13/2012    Family History  Problem Relation Age of Onset  . Anesthesia problems Neg Hx   . Hypotension Neg Hx   . Malignant  hyperthermia Neg Hx   . Pseudochol deficiency Neg Hx     Social History   Socioeconomic History  . Marital status: Married    Spouse name: Not on file  . Number of children: Not on file  . Years of education: Not on file  . Highest education level: Not on file  Occupational History  . Not on file  Social Needs  . Financial resource strain: Not on file  . Food insecurity    Worry: Not on file    Inability: Not on file  . Transportation needs    Medical: Not on file    Non-medical: Not on file  Tobacco Use  . Smoking status: Former Games developer  . Smokeless tobacco: Current User    Types: Chew  . Tobacco comment: quit smoking about 55yrs ago  Substance and Sexual Activity  . Alcohol use: No  . Drug use: No  . Sexual activity: Yes  Lifestyle  . Physical activity    Days per week: Not on file    Minutes per session: Not on file  . Stress: Not on file  Relationships  . Social Musician on phone: Not on file    Gets together: Not on file    Attends religious service: Not on file    Active member of club or organization: Not on file    Attends meetings of clubs or organizations: Not on file    Relationship status:  Not on file  . Intimate partner violence    Fear of current or ex partner: Not on file    Emotionally abused: Not on file    Physically abused: Not on file    Forced sexual activity: Not on file  Other Topics Concern  . Not on file  Social History Narrative  . Not on file    Review of Systems: Gen: Denies any fever, chills, fatigue, weight loss, lack of appetite.  CV: Denies chest pain, heart palpitations, peripheral edema, syncope.  Resp: Denies shortness of breath at rest or with exertion. Denies wheezing or cough.  GI: Denies dysphagia or odynophagia. Denies jaundice, hematemesis, fecal incontinence. GU : Denies urinary burning, urinary frequency, urinary hesitancy MS: Denies joint pain, muscle weakness, cramps, or limitation of movement.   Derm: Denies rash, itching, dry skin Psych: Denies depression, anxiety, memory loss, and confusion Heme: Denies bruising, bleeding, and enlarged lymph nodes.  Physical Exam: There were no vitals taken for this visit. General:   Alert and oriented. Pleasant and cooperative. Well-nourished and well-developed.  Head:  Normocephalic and atraumatic. Eyes:  Without icterus, sclera clear and conjunctiva pink.  Ears:  Normal auditory acuity. Nose:  No deformity, discharge,  or lesions. Mouth:  No deformity or lesions, oral mucosa pink.  Neck:  Supple, without mass or thyromegaly. Lungs:  Clear to auscultation bilaterally. No wheezes, rales, or rhonchi. No distress.  Heart:  S1, S2 present without murmurs appreciated.  Abdomen:  +BS, soft, non-tender and non-distended. No HSM noted. No guarding or rebound. No masses appreciated.  Rectal:  Deferred  Msk:  Symmetrical without gross deformities. Normal posture. Pulses:  Normal pulses noted. Extremities:  Without clubbing or edema. Neurologic:  Alert and  oriented x4;  grossly normal neurologically. Skin:  Intact without significant lesions or rashes. Cervical Nodes:  No significant cervical adenopathy. Psych:  Alert and cooperative. Normal mood and affect.

## 2019-09-08 ENCOUNTER — Ambulatory Visit: Payer: Medicare Other | Admitting: Gastroenterology

## 2019-09-08 ENCOUNTER — Encounter: Payer: Self-pay | Admitting: Gastroenterology

## 2019-09-08 ENCOUNTER — Telehealth: Payer: Self-pay | Admitting: Gastroenterology

## 2019-09-08 NOTE — Telephone Encounter (Signed)
Patient was a no show and letter sent  °

## 2019-09-14 NOTE — Progress Notes (Deleted)
Referring Provider: Vidal Schwalbe, MD Primary Care Physician:  Vidal Schwalbe, MD Primary Gastroenterologist:  Dr. Oneida Alar  No chief complaint on file.   HPI:   Andrew Pugh is a 58 y.o. male presenting today at the request of Vidal Schwalbe, MD for GERD.  CT chest with contrast on file from 06/17/2019 with extensive bandlike scarring or atelectasis of the bilateral lower lobes and right middle lobe, with dependent bronchiectasis and bronchial wall thickening.  There is extensive frothy debris's within the right lower lobe bronchi.  Constellation of findings is generally consistent with recurrent or ongoing aspiration.  Today he states  Past Medical History:  Diagnosis Date  . Arthritis   . Bruises easily    pt on Diclofenac  . Chronic back pain    herniated disc;rheumatoid arthritis  . Hyperlipidemia    was Pravastatin;was taken off of it since Feb 28.  . Pneumonia    hx of> 54yrs ago  . Sleep apnea    doesn't use CPAP;sleep study done > 27yr ago  . Urinary frequency    drinks lots of water    Past Surgical History:  Procedure Laterality Date  . LUMBAR LAMINECTOMY/DECOMPRESSION MICRODISCECTOMY  02/18/2012   Procedure: LUMBAR LAMINECTOMY/DECOMPRESSION MICRODISCECTOMY 2 LEVELS;  Surgeon: Otilio Connors, MD;  Location: [facility];  Service: Neurosurgery;  Laterality: Left;  Left Lumbar three-four  Lumbar four five Decompressive Laminectomy/Diskectomy  . TONSILLECTOMY     as a child    Current Outpatient Medications  Medication Sig Dispense Refill  . diclofenac (VOLTAREN) 75 MG EC tablet Take 75 mg by mouth 2 (two) times daily.     No current facility-administered medications for this visit.     Allergies as of 09/15/2019 - Review Complete 02/18/2012  Allergen Reaction Noted  . Codeine Rash 02/13/2012    Family History  Problem Relation Age of Onset  . Anesthesia problems Neg Hx   . Hypotension Neg Hx   . Malignant hyperthermia Neg Hx   . Pseudochol  deficiency Neg Hx     Social History   Socioeconomic History  . Marital status: Married    Spouse name: Not on file  . Number of children: Not on file  . Years of education: Not on file  . Highest education level: Not on file  Occupational History  . Not on file  Social Needs  . Financial resource strain: Not on file  . Food insecurity    Worry: Not on file    Inability: Not on file  . Transportation needs    Medical: Not on file    Non-medical: Not on file  Tobacco Use  . Smoking status: Former Research scientist (life sciences)  . Smokeless tobacco: Current User    Types: Chew  . Tobacco comment: quit smoking about 52yrs ago  Substance and Sexual Activity  . Alcohol use: No  . Drug use: No  . Sexual activity: Yes  Lifestyle  . Physical activity    Days per week: Not on file    Minutes per session: Not on file  . Stress: Not on file  Relationships  . Social Herbalist on phone: Not on file    Gets together: Not on file    Attends religious service: Not on file    Active member of club or organization: Not on file    Attends meetings of clubs or organizations: Not on file    Relationship status: Not on file  . Intimate partner violence  Fear of current or ex partner: Not on file    Emotionally abused: Not on file    Physically abused: Not on file    Forced sexual activity: Not on file  Other Topics Concern  . Not on file  Social History Narrative  . Not on file    Review of Systems: Gen: Denies any fever, chills, fatigue, weight loss, lack of appetite.  CV: Denies chest pain, heart palpitations, peripheral edema, syncope.  Resp: Denies shortness of breath at rest or with exertion. Denies wheezing or cough.  GI: Denies dysphagia or odynophagia. Denies jaundice, hematemesis, fecal incontinence. GU : Denies urinary burning, urinary frequency, urinary hesitancy MS: Denies joint pain, muscle weakness, cramps, or limitation of movement.  Derm: Denies rash, itching, dry skin  Psych: Denies depression, anxiety, memory loss, and confusion Heme: Denies bruising, bleeding, and enlarged lymph nodes.  Physical Exam: There were no vitals taken for this visit. General:   Alert and oriented. Pleasant and cooperative. Well-nourished and well-developed.  Head:  Normocephalic and atraumatic. Eyes:  Without icterus, sclera clear and conjunctiva pink.  Ears:  Normal auditory acuity. Nose:  No deformity, discharge,  or lesions. Mouth:  No deformity or lesions, oral mucosa pink.  Neck:  Supple, without mass or thyromegaly. Lungs:  Clear to auscultation bilaterally. No wheezes, rales, or rhonchi. No distress.  Heart:  S1, S2 present without murmurs appreciated.  Abdomen:  +BS, soft, non-tender and non-distended. No HSM noted. No guarding or rebound. No masses appreciated.  Rectal:  Deferred  Msk:  Symmetrical without gross deformities. Normal posture. Pulses:  Normal pulses noted. Extremities:  Without clubbing or edema. Neurologic:  Alert and  oriented x4;  grossly normal neurologically. Skin:  Intact without significant lesions or rashes. Cervical Nodes:  No significant cervical adenopathy. Psych:  Alert and cooperative. Normal mood and affect.

## 2019-09-15 ENCOUNTER — Ambulatory Visit: Payer: Medicare Other | Admitting: Gastroenterology

## 2019-10-12 DEATH — deceased

## 2020-11-26 IMAGING — CT CT CHEST WITH CONTRAST
2 of 3 series · 15 of 36 positions shown, 18 images · IV contrast (omnipaque)
Comparison: Chest radiographs, 11/15/2018

CLINICAL DATA: Atelectasis versus chronic scarring of right base,
perihilar infiltrate

EXAM:
CT CHEST WITH CONTRAST
TECHNIQUE: Multidetector CT imaging of the chest was performed during
intravenous contrast administration.
CONTRAST:  75mL OMNIPAQUE IOHEXOL 300 MG/ML  SOLN

[Series 2: routine chest with · axial · 0.65mm/px · z∈[+1273,+1527]mm · 12 of 151 slices shown, 15 images]
[im 12/151  mediastinal]
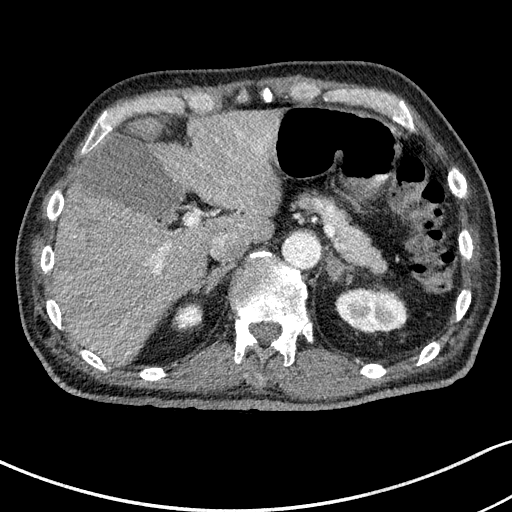
[im 12/151  lung]
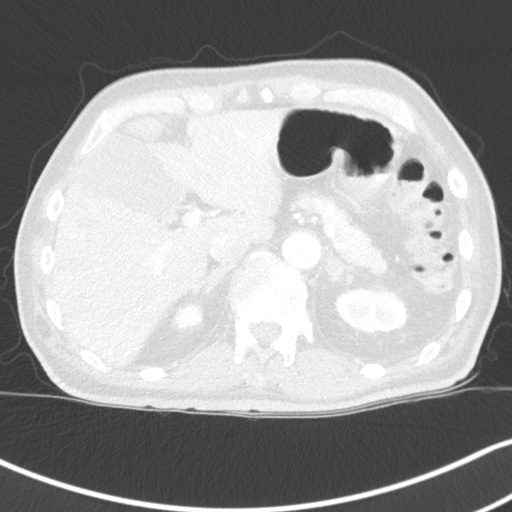
[im 23/151  lung]
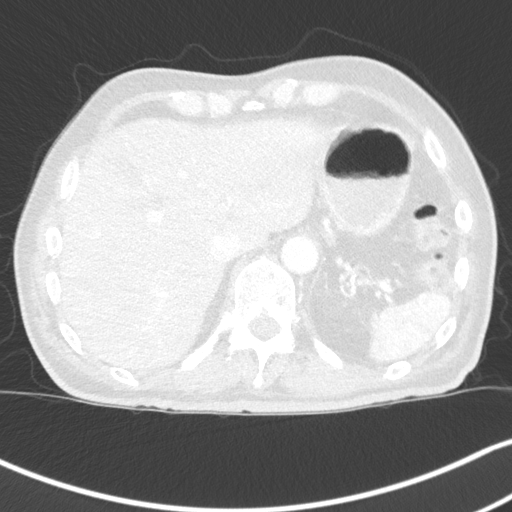
[im 34/151  lung]
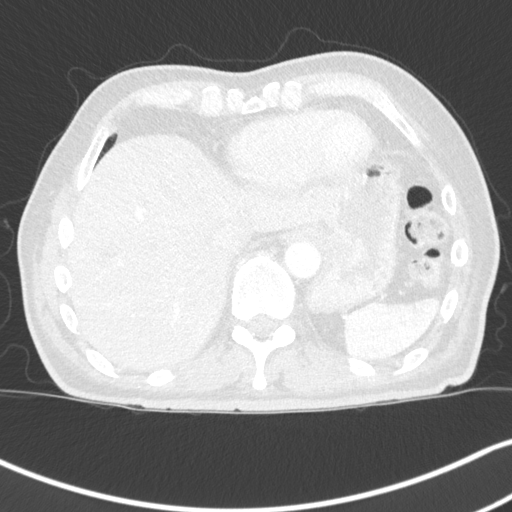
[im 45/151  lung]
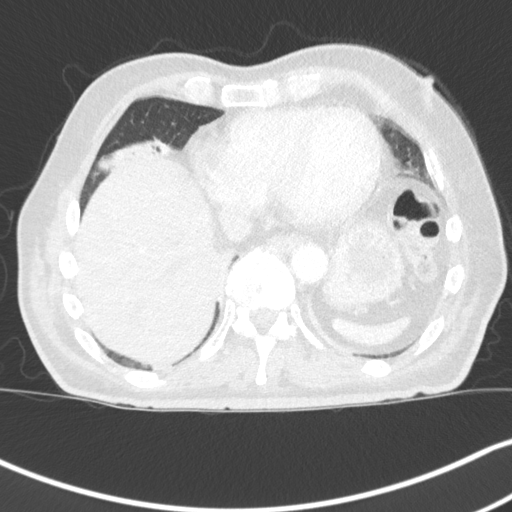
[im 56/151  mediastinal]
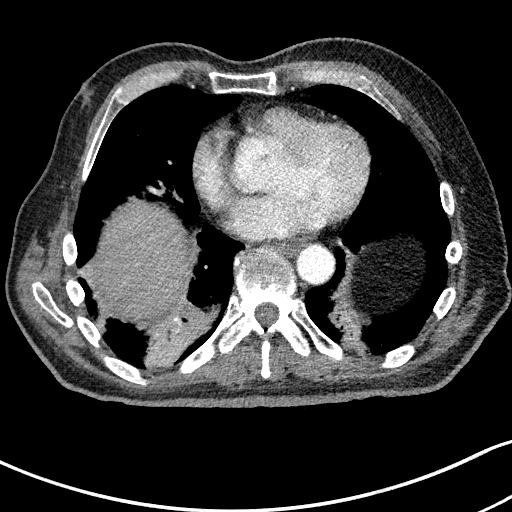
[im 56/151  lung]
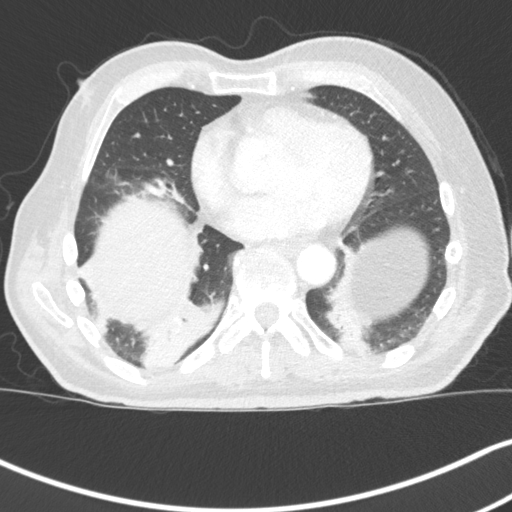
[im 67/151  lung]
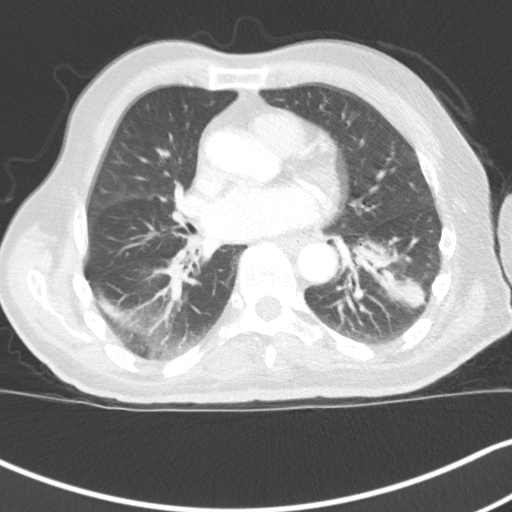
[im 84/151  lung]
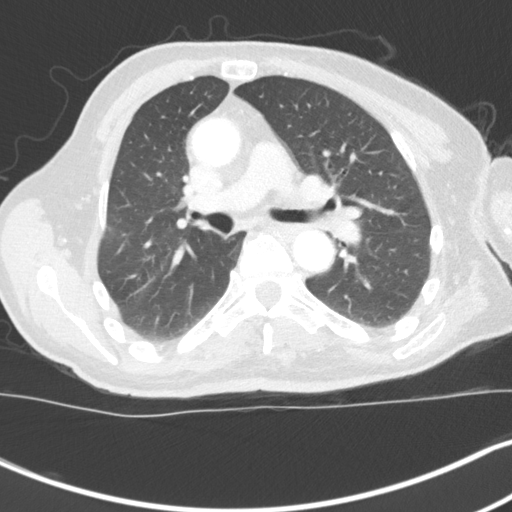
[im 95/151  lung]
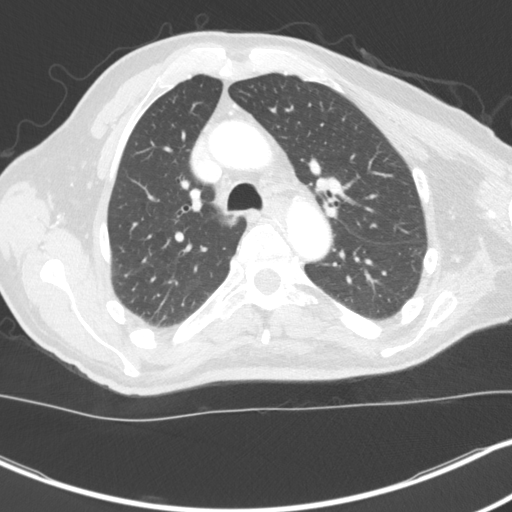
[im 106/151  mediastinal]
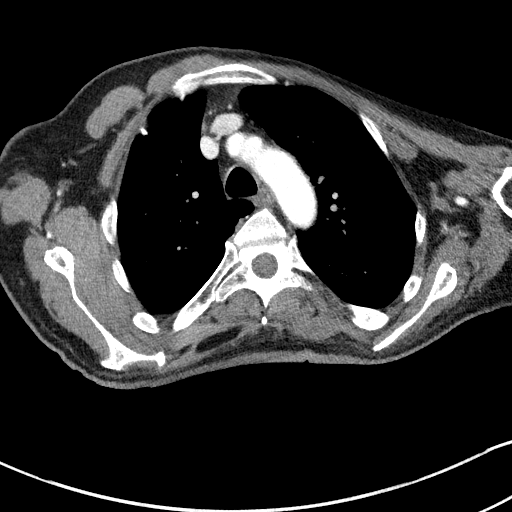
[im 106/151  lung]
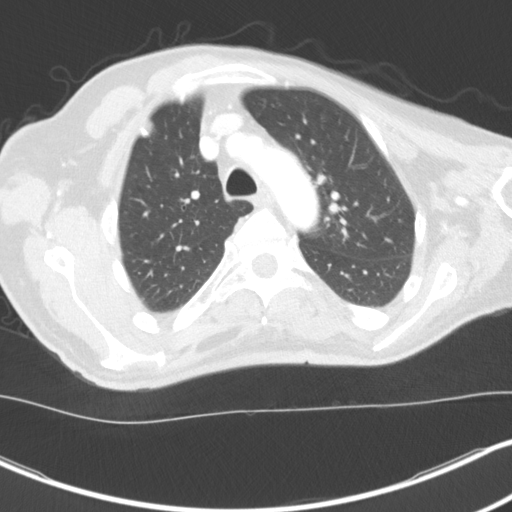
[im 117/151  lung]
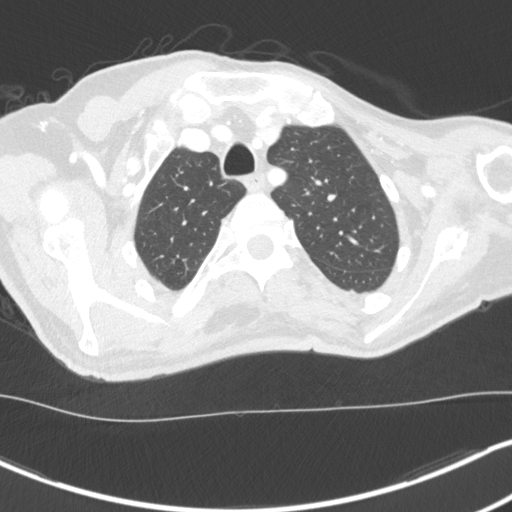
[im 128/151  lung]
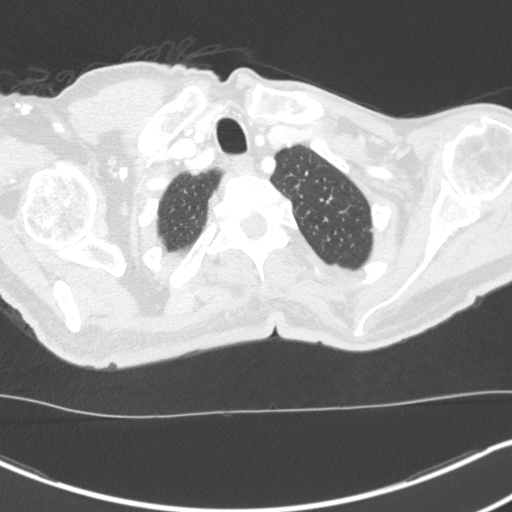
[im 139/151  lung]
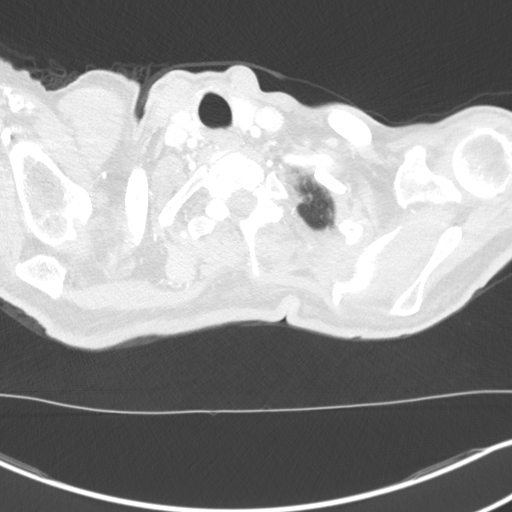

[Series 5: coronal · coronal · 0.60mm/px · 3 of 134 slices shown]
[im 27/134  lung]
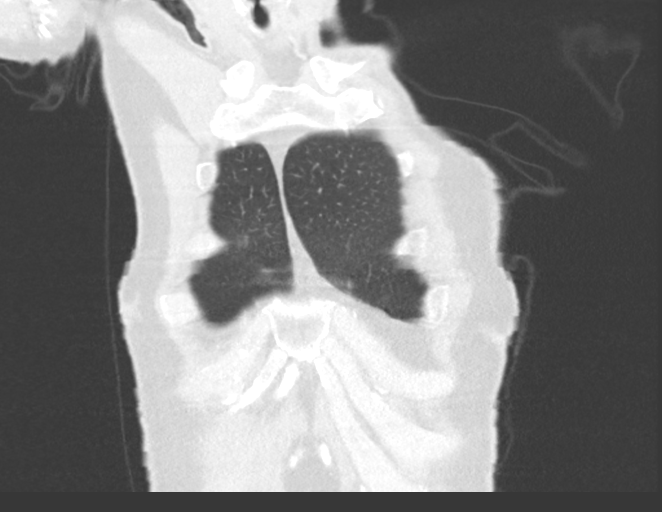
[im 54/134  lung]
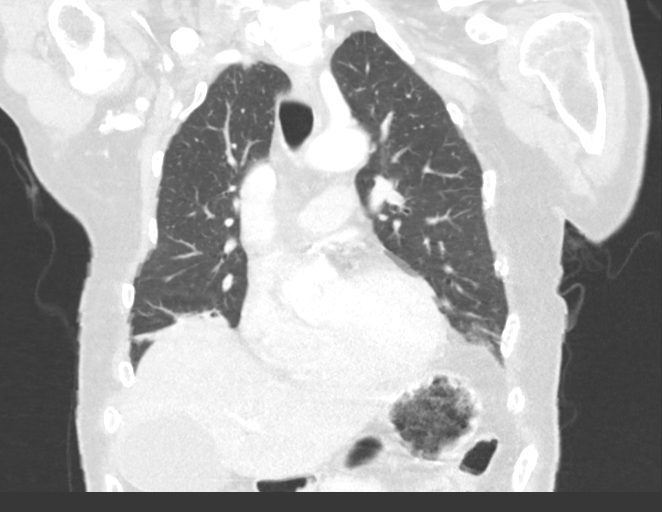
[im 80/134  lung]
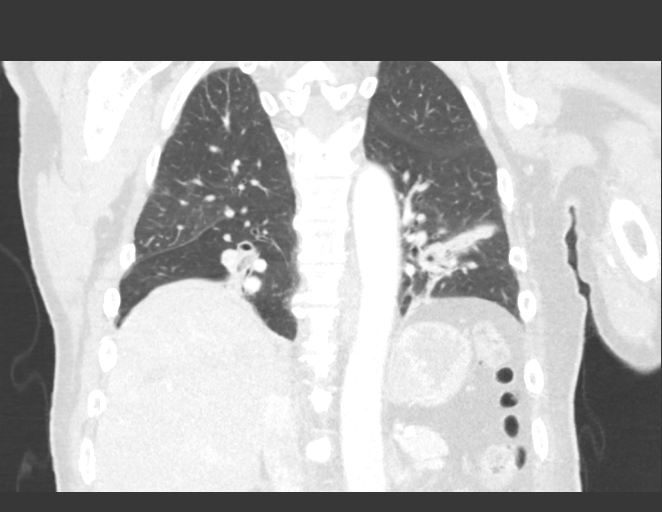

[15 of 36 positions shown; findings below may reference images not displayed]

FINDINGS: Cardiovascular: Left coronary artery calcifications. Normal heart
size. No pericardial effusion.

Mediastinum/Nodes: No enlarged mediastinal, hilar, or axillary lymph
nodes. Thyroid gland, trachea, and esophagus demonstrate no
significant findings.

Lungs/Pleura: There is extensive bandlike scarring or atelectasis of
the bilateral lower lobes and right middle lobe, with dependent
bronchiectasis and bronchial wall thickening. There is extensive
frothy debris within the right lower lobe bronchi. No pleural
effusion or pneumothorax.

Upper Abdomen: No acute abnormality.

Musculoskeletal: No chest wall mass or suspicious bone lesions
identified.
IMPRESSION: 1. There is extensive bandlike scarring or atelectasis of the
bilateral lower lobes and right middle lobe, with dependent
bronchiectasis and bronchial wall thickening. There is extensive
frothy debris within the right lower lobe bronchi. Constellation of
findings is generally consistent with recurrent or ongoing
aspiration.

2.  Coronary artery disease.
